# Patient Record
Sex: Female | Born: 2005 | Race: White | Hispanic: No | Marital: Single | Smoking: Never smoker
Health system: Southern US, Community
[De-identification: ages and names within clinical notes are randomized; demographics above are authoritative.]

## PROBLEM LIST (undated history)

## (undated) DIAGNOSIS — E669 Obesity, unspecified: Secondary | ICD-10-CM

## (undated) DIAGNOSIS — N390 Urinary tract infection, site not specified: Secondary | ICD-10-CM

## (undated) HISTORY — DX: Obesity, unspecified: E66.9

---
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
## 2023-02-16 ENCOUNTER — Ambulatory Visit
Admission: EM | Admit: 2023-02-16 | Discharge: 2023-02-16 | Disposition: A | Discharge status: Home / Self Care | Payer: Medicaid Other | Attending: Family Medicine | Admitting: Family Medicine

## 2023-02-16 ENCOUNTER — Ambulatory Visit (INDEPENDENT_AMBULATORY_CARE_PROVIDER_SITE_OTHER): Payer: Medicaid Other

## 2023-02-16 DIAGNOSIS — R079 Chest pain, unspecified: Secondary | ICD-10-CM | POA: Diagnosis not present

## 2023-02-16 MED ORDER — IBUPROFEN 800 MG PO TABS: 800.0000 mg | ORAL_TABLET | Freq: Three times a day (TID) | ORAL | 0 refills | Status: DC

## 2023-02-16 NOTE — ED Triage Notes (Signed)
Pt presents with right side rib pain and flank pain with no known injury X 2 days.

## 2023-02-17 NOTE — ED Provider Notes (Signed)
[facility]   [id number] 02/16/23 Arrival Time: 1757  ASSESSMENT & PLAN:  1. Right-sided chest pain     Patient history and exam consistent with non-cardiac cause of chest pain. I have personally viewed and independently interpreted the imaging studies ordered this visit. No acute changes on CXR; no pneumothorax.  She is comfortable with observation. Treat for likely MSK chest wall pain.  Meds ordered this encounter  Medications   ibuprofen (ADVIL) 800 MG tablet    Sig: Take 1 tablet (800 mg total) by mouth 3 (three) times daily with meals.    Dispense:  21 tablet    Refill:  0    Chest pain precautions given. Reviewed expectations re: course of current medical issues. Questions answered. Outlined signs and symptoms indicating need for more acute intervention. Patient verbalized understanding. After Visit Summary given.   SUBJECTIVE:  History from: patient. Natasha Edwards is a 17 y.o. female who presents with complaint of non-radiating right-sided chest/rib pain; x 2 days; gradual onset; denies injury/trauma. Denies SOB. Afebrile. No tx PTA. Denies recreational drug use. No rash.  Social History   Tobacco Use  Smoking Status Never  Smokeless Tobacco Never   Social History   Substance and Sexual Activity  Alcohol Use No    OBJECTIVE:  Vitals:   02/16/23 1854  BP: 125/78  Pulse: (!) 122  Resp: 17  Temp: 98.7 F (37.1 C)  TempSrc: Oral  SpO2: 98%    Tachycardia noted but this appears to be her baseline compared with VS in Epic.  General appearance: alert, oriented, no acute distress Eyes: PERRLA; EOMI; conjunctivae normal HENT: normocephalic; atraumatic Neck: supple with FROM Lungs: without labored respirations; speaks full sentences without difficulty; CTAB Heart: regular rate and rhythm without murmer Chest Wall: with some tenderness to palpation over R sided chest wall Abdomen: soft, non-tender; no guarding or rebound  tenderness Extremities: without edema; without calf swelling or tenderness; symmetrical without gross deformities Skin: warm and dry; without rash or lesions Neuro: normal gait Psychological: alert and cooperative; normal mood and affect  Imaging: DG Chest 2 View  Result Date: 02/16/2023 CLINICAL DATA:  Chest pain. EXAM: CHEST - 2 VIEW COMPARISON:  None Available. FINDINGS: The heart size and mediastinal contours are within normal limits. Both lungs are clear. The visualized skeletal structures are unremarkable. IMPRESSION: No active cardiopulmonary disease. Electronically Signed   By: Aram Candela M.D.   On: 02/16/2023 19:28     No Known Allergies  Past Medical History:  Diagnosis Date   Obesity (BMI 30.0-34.9) 04/05/2018   UTI (urinary tract infection)    on an antibiotic for one year.    Social History   Socioeconomic History   Marital status: Single    Spouse name: Not on file   Number of children: Not on file   Years of education: Not on file   Highest education level: Not on file  Occupational History   Not on file  Tobacco Use   Smoking status: Never   Smokeless tobacco: Never  Vaping Use   Vaping Use: Never used  Substance and Sexual Activity   Alcohol use: No   Drug use: No   Sexual activity: Never  Other Topics Concern   Not on file  Social History Narrative   Not on file   Social Determinants of Health   Financial Resource Strain: Not on file  Food Insecurity: Not on file  Transportation Needs: Not on file  Physical Activity: Not on  file  Stress: Not on file  Social Connections: Not on file  Intimate Partner Violence: Not on file   History reviewed. No pertinent family history. Past Surgical History:  Procedure Laterality Date   ORIF ANKLE FRACTURE Right 08/15/2019   Procedure: OPEN REDUCTION INTERNAL FIXATION (ORIF) ANKLE FRACTURE;  Surgeon: Vickki Hearing, MD;  Location: [facility];  Service: Orthopedics;  Laterality: Right;      Mardella Layman, MD 02/17/23 [phone]

## 2023-02-18 ENCOUNTER — Ambulatory Visit (INDEPENDENT_AMBULATORY_CARE_PROVIDER_SITE_OTHER): Payer: Medicaid Other | Admitting: Family Medicine

## 2023-02-18 VITALS — BP 124/85 | HR 104 | Ht 72.04 in | Wt 242.8 lb

## 2023-02-18 DIAGNOSIS — E161 Other hypoglycemia: Secondary | ICD-10-CM | POA: Diagnosis not present

## 2023-02-18 DIAGNOSIS — E669 Obesity, unspecified: Secondary | ICD-10-CM

## 2023-02-18 DIAGNOSIS — E282 Polycystic ovarian syndrome: Secondary | ICD-10-CM | POA: Diagnosis not present

## 2023-02-18 DIAGNOSIS — E66811 Obesity, class 1: Secondary | ICD-10-CM

## 2023-02-18 DIAGNOSIS — R635 Abnormal weight gain: Secondary | ICD-10-CM

## 2023-02-18 NOTE — Progress Notes (Signed)
   Subjective:    Patient ID: Natasha Edwards, female    DOB: 03-09-2006, 17 y.o.   MRN: [id number]  HPI Very nice young patient here today for discussion about her weight gain she has underlying PCOS She is doing part-time work she is also working toward getting a degree in Oncologist She denies being depressed States her moods overall are doing well At times she feels like she is on the outside Diesel Lina in this will last 1 to 3 days at a time Patient arrives today wanted to discuss weight loss medication and patient is having issues with derealization (patient states it comes and goes).     Review of Systems     Objective:   Physical Exam General-in no acute distress Eyes-no discharge Lungs-respiratory rate normal, CTA CV-no murmurs,RRR Extremities skin warm dry no edema Neuro grossly normal Behavior normal, alert        Assessment & Plan:  1. PCOS (polycystic ovarian syndrome) Check lab work await the results Healthy diet printout given Activity printout given Hold off on medications currently There are no weight loss medicine suitable that are covered - Basic Metabolic Panel (7)  2. Hyperinsulinemia Check lab work await results may need further testing depending on these - Insulin, random - Glucose, Random  3. Obesity (BMI 30.0-34.9) Portion control regular physical activity regular activity is very important We did discuss healthy snacks  4. Weight gain Fit in regular physical activity and exercise lab works ordered - Basic Metabolic Panel (7) - TSH

## 2023-02-19 LAB — BASIC METABOLIC PANEL (7)
BUN/Creatinine Ratio: 16 (ref 9–23)
BUN: 12 mg/dL (ref 6–20)
CO2: 18 mmol/L — ABNORMAL LOW (ref 20–29)
Chloride: 105 mmol/L (ref 96–106)
Creatinine, Ser: 0.75 mg/dL (ref 0.57–1.00)
Glucose: 84 mg/dL (ref 70–99)
Potassium: 4.1 mmol/L (ref 3.5–5.2)
Sodium: 143 mmol/L (ref 134–144)
eGFR: 118 mL/min/{1.73_m2} (ref >59)

## 2023-02-19 LAB — TSH: TSH: 2.83 u[IU]/mL (ref 0.450–4.500)

## 2023-02-19 LAB — INSULIN, RANDOM: INSULIN: 27.6 u[IU]/mL — ABNORMAL HIGH (ref 2.6–24.9)

## 2023-05-25 NOTE — Progress Notes (Signed)
This encounter was created in error - please disregard.

## 2023-06-17 ENCOUNTER — Ambulatory Visit: Payer: Medicaid Other | Admitting: Nurse Practitioner

## 2023-06-17 ENCOUNTER — Encounter: Payer: Self-pay | Admitting: Nurse Practitioner

## 2023-06-17 VITALS — BP 125/80 | HR 107 | Temp 98.5°F | Ht 72.0 in | Wt 256.0 lb

## 2023-06-17 DIAGNOSIS — Z0000 Encounter for general adult medical examination without abnormal findings: Secondary | ICD-10-CM | POA: Diagnosis not present

## 2023-06-17 DIAGNOSIS — Z01419 Encounter for gynecological examination (general) (routine) without abnormal findings: Secondary | ICD-10-CM

## 2023-06-17 DIAGNOSIS — Z111 Encounter for screening for respiratory tuberculosis: Secondary | ICD-10-CM

## 2023-06-17 NOTE — Progress Notes (Signed)
Subjective:    Patient ID: Natasha Edwards, female    DOB: Mar 14, 2006, 17 y.o.   MRN: [id number]  HPI The patient comes in today for a wellness visit.    A review of their health history was completed.  A review of medications was also completed.  Any needed refills; no  Eating habits: Eating out more often, consumes sugary beverages.  Falls/  MVA accidents in past few months: no  Regular exercise: Yes, participates in softball several times a week.   Specialist pt sees on regular basis: no  Preventative health issues were discussed.   Additional concerns: school form completion and TB testing  Sexual History: No history of sexual activity, not currently taking birth control. Reports regular periods, lasting 4-5 days with average flow. Denies painful menstrual cramps.    Review of Systems  Constitutional:  Negative for activity change, appetite change and fatigue.  HENT:  Negative for sore throat and trouble swallowing.   Eyes:  Negative for visual disturbance.  Respiratory:  Negative for cough, chest tightness, shortness of breath and wheezing.   Cardiovascular:  Negative for chest pain.  Gastrointestinal:  Negative for abdominal distention, abdominal pain, constipation, diarrhea, nausea and vomiting.  Genitourinary:  Negative for difficulty urinating, dysuria, frequency, genital sores, menstrual problem, pelvic pain, urgency and vaginal discharge.      06/17/2023    8:33 AM  Depression screen PHQ 2/9  Decreased Interest 0  Down, Depressed, Hopeless 0  PHQ - 2 Score 0  Altered sleeping 1  Tired, decreased energy 1  Change in appetite 1  Feeling bad or failure about yourself  0  Trouble concentrating 0  Moving slowly or fidgety/restless 0  Suicidal thoughts 0  PHQ-9 Score 3  Difficult doing work/chores Not difficult at all      06/17/2023    8:34 AM 02/18/2023    8:42 AM  GAD 7 : Generalized Anxiety Score  Nervous, Anxious, on Edge 0 1  Control/stop  worrying 1 0  Worry too much - different things 0 1  Trouble relaxing 0 0  Restless 0 0  Easily annoyed or irritable 1 1  Afraid - awful might happen 0 0  Total GAD 7 Score 2 3  Anxiety Difficulty Not difficult at all Not difficult at all         Objective:   Physical Exam Vitals and nursing note reviewed.  Constitutional:      General: She is not in acute distress.    Appearance: Normal appearance. She is well-developed.  HENT:     Right Ear: Tympanic membrane normal.     Left Ear: Tympanic membrane normal.     Nose: Nose normal.     Mouth/Throat:     Mouth: Mucous membranes are moist.     Pharynx: Oropharynx is clear.  Eyes:     Conjunctiva/sclera: Conjunctivae normal.     Pupils: Pupils are equal, round, and reactive to light.  Neck:     Thyroid: No thyromegaly.     Comments: Thyroid non-tender to palpation. No nodules or goiter noted.  Cardiovascular:     Rate and Rhythm: Normal rate and regular rhythm.     Heart sounds: Normal heart sounds. No murmur heard. Pulmonary:     Effort: Pulmonary effort is normal.     Breath sounds: Normal breath sounds. No wheezing.  Abdominal:     General: There is no distension.     Palpations: Abdomen is soft. There  is no mass.     Tenderness: There is no abdominal tenderness.  Genitourinary:    Comments: Defers GU and breast exams. Denies any problems.  Musculoskeletal:        General: Normal range of motion.     Cervical back: Normal range of motion and neck supple.  Lymphadenopathy:     Cervical: No cervical adenopathy.  Skin:    General: Skin is warm and dry.     Comments: Previous keloid removal incision site to right outer thigh, measuring 4cm x 7cm. Non-tender, firm.  Neurological:     Mental Status: She is alert and oriented to person, place, and time.     Deep Tendon Reflexes: Reflexes are normal and symmetric.  Psychiatric:        Mood and Affect: Mood normal.        Behavior: Behavior normal.        Thought  Content: Thought content normal.        Judgment: Judgment normal.    Today's Vitals   06/17/23 0825  BP: 125/80  Pulse: (!) 107  Temp: 98.5 F (36.9 C)  SpO2: 98%  Weight: 256 lb (116.1 kg)  Height: 6' (1.829 m)   Body mass index is 34.72 kg/m.        Assessment & Plan:   1. Screening-pulmonary TB Results of TB pending. Other immunizations up to date.  - QuantiFERON-TB Gold Plus  2. Well woman exam Discussed increasing exercise to include 30-60 minutes of walking or other activities 3-4x per week. Encouraged eliminating sugary drinks and reducing the amount of fast food consumed. Educated on receiving the flu vaccine for the upcoming season. She gets regular dental exams. Educated on safe-sex practices and encouraged to follow up for any change in sexual activity and/or desire for contraceptives.    Return in about 1 year (around 06/16/2024) for physical.

## 2023-06-17 NOTE — Progress Notes (Signed)
   Subjective:    Patient ID: Natasha Edwards, female    DOB: 2006/06/30, 17 y.o.   MRN: [id number]  HPI    Review of Systems     Objective:   Physical Exam        Assessment & Plan:

## 2023-06-23 LAB — QUANTIFERON-TB GOLD PLUS
QuantiFERON Mitogen Value: >10 [IU]/mL
QuantiFERON Nil Value: 0.01 [IU]/mL
QuantiFERON TB1 Ag Value: 0.01 [IU]/mL
QuantiFERON TB2 Ag Value: 0.01 [IU]/mL
QuantiFERON-TB Gold Plus: NEGATIVE

## 2024-04-11 ENCOUNTER — Ambulatory Visit
Admission: EM | Admit: 2024-04-11 | Discharge: 2024-04-11 | Disposition: A | Discharge status: Home / Self Care | Attending: Family Medicine | Admitting: Family Medicine

## 2024-04-11 DIAGNOSIS — H60501 Unspecified acute noninfective otitis externa, right ear: Secondary | ICD-10-CM | POA: Diagnosis not present

## 2024-04-11 MED ORDER — CIPROFLOXACIN-DEXAMETHASONE 0.3-0.1 % OT SUSP: 4.0000 [drp] | Freq: Two times a day (BID) | OTIC | 0 refills | Status: DC

## 2024-04-11 NOTE — ED Provider Notes (Signed)
 [facility]    CSN: [id number] Arrival date & time: 04/11/24  1843      History   Chief Complaint No chief complaint on file.   HPI Natasha Edwards is a 18 y.o. female.   Patient presenting today with 1 day history of sharp stabbing right ear pain that radiates to the jaw.  Denies injury to the area, drainage, bleeding, loss of hearing, fevers, congestion.  So far trying Advil  with minimal relief.    Past Medical History:  Diagnosis Date   Obesity (BMI 30.0-34.9) 04/05/2018   UTI (urinary tract infection)    on an antibiotic for one year.     Patient Active Problem List   Diagnosis Date Noted   Encounter for routine child health examination without abnormal findings 10/03/2020   Need for vaccination 10/03/2020   S/P ORIF (open reduction internal fixation) fracture right ankle  08/15/19 09/26/2019   Closed fracture of right ankle 09/26/2019   Trimalleolar fracture of ankle, closed, right, with routine healing, subsequent encounter    PCOS (polycystic ovarian syndrome) 04/05/2018   Hyperinsulinemia 04/05/2018   Obesity (BMI 30.0-34.9) 04/05/2018   Amenorrhea 02/22/2018    Past Surgical History:  Procedure Laterality Date   ORIF ANKLE FRACTURE Right 08/15/2019   Procedure: OPEN REDUCTION INTERNAL FIXATION (ORIF) ANKLE FRACTURE;  Surgeon: Margrette Taft BRAVO, MD;  Location: [facility];  Service: Orthopedics;  Laterality: Right;    OB History   No obstetric history on file.      Home Medications    Prior to Admission medications   Medication Sig Start Date End Date Taking? Authorizing Provider  ciprofloxacin -dexamethasone  (CIPRODEX ) OTIC suspension Place 4 drops into the right ear 2 (two) times daily. 04/11/24   Stuart Vernell Norris, PA-C    Family History History reviewed. No pertinent family history.  Social History Social History   Tobacco Use   Smoking status: Never   Smokeless tobacco: Never  Vaping Use   Vaping status: Never Used   Substance Use Topics   Alcohol use: No   Drug use: No     Allergies   Patient has no known allergies.   Review of Systems Review of Systems Per HPI  Physical Exam Triage Vital Signs ED Triage Vitals  Encounter Vitals Group     BP 04/11/24 1855 134/71     Girls Systolic BP Percentile --      Girls Diastolic BP Percentile --      Boys Systolic BP Percentile --      Boys Diastolic BP Percentile --      Pulse --      Resp 04/11/24 1855 20     Temp 04/11/24 1855 98.1 F (36.7 C)     Temp Source 04/11/24 1855 Oral     SpO2 04/11/24 1855 98 %     Weight --      Height --      Head Circumference --      Peak Flow --      Pain Score 04/11/24 1858 7     Pain Loc --      Pain Education --      Exclude from Growth Chart --    No data found.  Updated Vital Signs BP 134/71 (BP Location: Right Arm)   Temp 98.1 F (36.7 C) (Oral)   Resp 20   SpO2 98%   Visual Acuity Right Eye Distance:   Left Eye Distance:   Bilateral Distance:    Right  Eye Near:   Left Eye Near:    Bilateral Near:     Physical Exam Vitals and nursing note reviewed.  Constitutional:      Appearance: Normal appearance. She is not ill-appearing.  HENT:     Head: Atraumatic.     Ears:     Comments: Pain with tugging on right auricle, right EAC erythematous, edematous, crusted drainage present.  Right TM benign appearing    Mouth/Throat:     Mouth: Mucous membranes are moist.     Pharynx: Oropharynx is clear.   Eyes:     Extraocular Movements: Extraocular movements intact.     Conjunctiva/sclera: Conjunctivae normal.    Cardiovascular:     Rate and Rhythm: Normal rate.  Pulmonary:     Effort: Pulmonary effort is normal.   Musculoskeletal:        General: Normal range of motion.     Cervical back: Normal range of motion and neck supple.   Skin:    General: Skin is warm and dry.   Neurological:     Mental Status: She is alert and oriented to person, place, and time.   Psychiatric:         Mood and Affect: Mood normal.        Thought Content: Thought content normal.        Judgment: Judgment normal.      UC Treatments / Results  Labs (all labs ordered are listed, but only abnormal results are displayed) Labs Reviewed - No data to display  EKG   Radiology No results found.  Procedures Procedures (including critical care time)  Medications Ordered in UC Medications - No data to display  Initial Impression / Assessment and Plan / UC Course  I have reviewed the triage vital signs and the nursing notes.  Pertinent labs & imaging results that were available during my care of the patient were reviewed by me and considered in my medical decision making (see chart for details).     Consistent with otitis externa, treat with Ciprodex  drops, over-the-counter pain relievers, supportive home care.  Return for worsening symptoms.  Final Clinical Impressions(s) / UC Diagnoses   Final diagnoses:  Acute otitis externa of right ear, unspecified type   Discharge Instructions   None    ED Prescriptions     Medication Sig Dispense Auth. Provider   ciprofloxacin -dexamethasone  (CIPRODEX ) OTIC suspension  (Status: Discontinued) Place 4 drops into the right ear 2 (two) times daily. 7.5 mL Stuart Vernell Norris, PA-C   ciprofloxacin -dexamethasone  (CIPRODEX ) OTIC suspension Place 4 drops into the right ear 2 (two) times daily. 7.5 mL Stuart Vernell Norris, [place]      PDMP not reviewed this encounter.   Stuart Vernell Norris, [place] 04/11/24 1932

## 2024-04-11 NOTE — ED Triage Notes (Signed)
 Pt reports right ear pain feeling pain and discomfort in the right side of face onset today.

## 2024-04-12 ENCOUNTER — Encounter: Payer: Self-pay | Admitting: Family Medicine

## 2024-04-12 ENCOUNTER — Telehealth: Payer: Self-pay

## 2024-04-12 NOTE — Telephone Encounter (Signed)
 Returned patient call regarding ear pain that is still going on from yesterday. Informed patient that sxs may not begin to clear up until 2 days on the cipro -dex. Pt verbalized understanding.

## 2024-04-14 ENCOUNTER — Encounter (HOSPITAL_COMMUNITY): Payer: Self-pay | Admitting: Emergency Medicine

## 2024-04-14 ENCOUNTER — Other Ambulatory Visit: Payer: Self-pay

## 2024-04-14 ENCOUNTER — Emergency Department (HOSPITAL_COMMUNITY)
Admission: EM | Admit: 2024-04-14 | Discharge: 2024-04-14 | Disposition: A | Discharge status: Home / Self Care | Attending: Emergency Medicine | Admitting: Emergency Medicine

## 2024-04-14 DIAGNOSIS — H60393 Other infective otitis externa, bilateral: Secondary | ICD-10-CM | POA: Insufficient documentation

## 2024-04-14 DIAGNOSIS — H9203 Otalgia, bilateral: Secondary | ICD-10-CM | POA: Insufficient documentation

## 2024-04-14 DIAGNOSIS — H9201 Otalgia, right ear: Secondary | ICD-10-CM | POA: Diagnosis present

## 2024-04-14 MED ORDER — HYDROCODONE-ACETAMINOPHEN 5-325 MG PO TABS: 1.0000 | ORAL_TABLET | Freq: Four times a day (QID) | ORAL | 0 refills | Status: DC | PRN

## 2024-04-14 MED ORDER — AMOXICILLIN-POT CLAVULANATE 875-125 MG PO TABS
1.0000 | ORAL_TABLET | Freq: Once | ORAL | Status: AC
  Administered 2024-04-14: 1 via ORAL
  Filled 2024-04-14: qty 1

## 2024-04-14 MED ORDER — OFLOXACIN 0.3 % OT SOLN: 3.0000 [drp] | Freq: Two times a day (BID) | OTIC | 0 refills | Status: AC

## 2024-04-14 MED ORDER — AMOXICILLIN-POT CLAVULANATE 875-125 MG PO TABS: 1.0000 | ORAL_TABLET | Freq: Two times a day (BID) | ORAL | 0 refills | Status: DC

## 2024-04-14 MED ORDER — HYDROCODONE-ACETAMINOPHEN 5-325 MG PO TABS
1.0000 | ORAL_TABLET | Freq: Once | ORAL | Status: AC
  Administered 2024-04-14: 1 via ORAL
  Filled 2024-04-14: qty 1

## 2024-04-14 NOTE — ED Provider Notes (Signed)
 [place] EM[facility]Y DEPARTMENT AT [facility]  Provider Note  CSN: [id number] Arrival date & time: 04/14/24 0409  History Chief Complaint  Patient presents with   Otalgia    Natasha Edwards is a 18 y.o. female with no significant PMH has had 3 days of R sided ear ache, saw UC on 6/24 and given ciprodex  drops for OE without improvement. Messaged her PCP 6/25 and advised to take Motrin /APAP for pain which has not been effective. She reports pain has moved to the left ear now. Denies fever. Some muffled hearing. No bleeding or discharge from ears.    Home Medications Prior to Admission medications   Medication Sig Start Date End Date Taking? Authorizing Provider  amoxicillin -clavulanate (AUGMENTIN) 875-125 MG tablet Take 1 tablet by mouth every 12 (twelve) hours. 04/14/24  Yes Roselyn Carlin NOVAK, MD  HYDROcodone -acetaminophen  (NORCO/VICODIN) 5-325 MG tablet Take 1 tablet by mouth every 6 (six) hours as needed for severe pain (pain score 7-10). 04/14/24  Yes Roselyn Carlin NOVAK, MD  ofloxacin (FLOXIN) 0.3 % OTIC solution Place 3 drops into both ears 2 (two) times daily for 7 days. 04/14/24 04/21/24 Yes Roselyn Carlin NOVAK, MD     Allergies    Patient has no known allergies.   Review of Systems   Review of Systems Please see HPI for pertinent positives and negatives  Physical Exam BP (!) 141/86 (BP Location: Right Arm)   Pulse (!) 117   Temp 98.8 F (37.1 C) (Oral)   Resp 18   Ht 6' (1.829 m)   Wt 116 kg   LMP 03/10/2024 (Approximate)   SpO2 97%   BMI 34.68 kg/m   Physical Exam Vitals and nursing note reviewed.  HENT:     Head: Normocephalic.     Ears:     Comments: R canal is swollen with purulent material, TM is obscured. L canal is swollen without discharge, limited view of TM appears intact and without infection.     Nose: Nose normal.   Eyes:     Extraocular Movements: Extraocular movements intact.   Pulmonary:     Effort: Pulmonary effort is normal.    Musculoskeletal:        General: Normal range of motion.     Cervical back: Neck supple.  Lymphadenopathy:     Cervical: No cervical adenopathy.   Skin:    Findings: No rash (on exposed skin).   Neurological:     Mental Status: She is alert and oriented to person, place, and time.   Psychiatric:        Mood and Affect: Mood normal.     ED Results / Procedures / Treatments   EKG None  Procedures Procedures  Medications Ordered in the ED Medications  amoxicillin -clavulanate (AUGMENTIN) 875-125 MG per tablet 1 tablet (has no administration in time range)  HYDROcodone -acetaminophen  (NORCO/VICODIN) 5-325 MG per tablet 1 tablet (1 tablet Oral Given 04/14/24 0440)    Initial Impression and Plan  Patient here with progressively worsening R OE now with signs of early L OE. Ear wick placed in R ear. Will switch Abx to oflaxacin given poor response to Ciprodex . Add oral Abx given non-visualized TM and Norco for breakthrough pain. Advised not to take with additional APAP.   ED Course       MDM Rules/Calculators/A&P Medical Decision Making Problems Addressed: Other infective acute otitis externa of both ears: acute illness or injury  Risk Prescription drug management.     Final Clinical  Impression(s) / ED Diagnoses Final diagnoses:  Other infective acute otitis externa of both ears    Rx / DC Orders ED Discharge Orders          Ordered    amoxicillin -clavulanate (AUGMENTIN) 875-125 MG tablet  Every 12 hours        04/14/24 0441    ofloxacin (FLOXIN) 0.3 % OTIC solution  2 times daily        04/14/24 0441    HYDROcodone -acetaminophen  (NORCO/VICODIN) 5-325 MG tablet  Every 6 hours PRN        04/14/24 0441             Roselyn Carlin NOVAK, MD 04/14/24 [phone]

## 2024-04-14 NOTE — ED Notes (Signed)
 ED Provider at bedside.

## 2024-04-14 NOTE — ED Triage Notes (Signed)
 Pt here for R ear pain (recently seen at [facility] and dx with swimmers ear . Pt taking ear drops without relief. States pain has moved to other ear as well.

## 2024-07-04 ENCOUNTER — Ambulatory Visit (INDEPENDENT_AMBULATORY_CARE_PROVIDER_SITE_OTHER): Admitting: Nurse Practitioner

## 2024-07-04 ENCOUNTER — Encounter: Payer: Self-pay | Admitting: Nurse Practitioner

## 2024-07-04 VITALS — BP 129/87 | HR 117 | Ht 72.0 in | Wt 263.0 lb

## 2024-07-04 DIAGNOSIS — H9192 Unspecified hearing loss, left ear: Secondary | ICD-10-CM

## 2024-07-04 DIAGNOSIS — J300 Vasomotor rhinitis: Secondary | ICD-10-CM

## 2024-07-04 NOTE — Patient Instructions (Signed)
 Flonase Allegra

## 2024-07-04 NOTE — Progress Notes (Unsigned)
 Subjective:    Patient ID: Natasha Edwards, female    DOB: [birth year], 18 y.o.   MRN: [id number]  HPI CC: Acute visit. ENT referral.  Patient arrived with the desire for an ENT referral due to inconsistent bilateral ear pain. She described the pain as beginning years ago and sharp. She stated that her ears are not currently hurting today but on her left side sounds are muffled. She does not experience any dizziness, n/v, or chest pain. Was seen at [facility] and ED on 6/24 and 6/27 with a diagnosis of otitis externa. No drainage from the ears.    Review of Systems  Constitutional:  Negative for fever.  HENT:  Positive for ear pain. Negative for ear discharge and sore throat.   Respiratory:  Negative for cough, shortness of breath and wheezing.   Cardiovascular:  Negative for chest pain.  Gastrointestinal:  Negative for constipation, diarrhea, nausea and vomiting.  Neurological:  Negative for dizziness and syncope.   Social History   Tobacco Use   Smoking status: Never   Smokeless tobacco: Never  Vaping Use   Vaping status: Never Used  Substance Use Topics   Alcohol use: No   Drug use: No        Objective:   Physical Exam Vitals and nursing note reviewed.  Constitutional:      Appearance: Normal appearance.  HENT:     Right Ear: Ear canal and external ear normal. No drainage or tenderness. Tympanic membrane is not erythematous.     Left Ear: Ear canal and external ear normal. No drainage or tenderness. Tympanic membrane is not erythematous.     Ears:     Comments: Bilateral TM  visualized with clear fluid.  Right ear partially obscured. cerumen removal with curette without difficulty No tenderness with movement of the pinna or tragus bilaterally.     Mouth/Throat:     Mouth: Mucous membranes are moist.     Pharynx: Oropharynx is clear. No posterior oropharyngeal erythema.     Comments: Slight cloudy PND noted.  Cardiovascular:     Rate and Rhythm: Normal rate and regular  rhythm.  Pulmonary:     Effort: Pulmonary effort is normal.     Breath sounds: Normal breath sounds.  Musculoskeletal:     Cervical back: Neck supple.  Lymphadenopathy:     Cervical: No cervical adenopathy.  Neurological:     Mental Status: She is alert.     Comments: Can hear in conversational tones. Grossly intact.  Psychiatric:        Mood and Affect: Mood normal.        Behavior: Behavior normal.        Thought Content: Thought content normal.    Vitals:   07/04/24 1505  BP: 129/87  Pulse: (!) 117  Height: 6' (1.829 m)  Weight: 119.3 kg  SpO2: 96%  BMI (Calculated): 35.66          Assessment & Plan:  1. Vasomotor rhinitis (Primary) Patient educated on taking over the counter Flonase and Allegra.  She was given education on how to decompact her ears at home with 1/2 hydrogen peroxide, 1/2 warm water mixture and provided 2 droppers.  If symptoms do to not improve within 7-10 days, patient should contact the office for an ENT referral.  2. Decreased hearing of left ear most likely due to effusion Patient educated on taking over the counter Flonase and Allergra.  If symptoms do to not improve within 7-10  days, patient should contact the office for an ENT referral.   Return if symptoms worsen or fail to improve.   I have seen and examined this patient alongside the NP student. I have reviewed and verified the student note and agree with the assessment and plan.  Elveria Quarry, FNP

## 2024-07-06 ENCOUNTER — Encounter: Payer: Self-pay | Admitting: Nurse Practitioner

## 2024-07-11 ENCOUNTER — Ambulatory Visit

## 2024-07-11 DIAGNOSIS — Z111 Encounter for screening for respiratory tuberculosis: Secondary | ICD-10-CM

## 2024-07-13 LAB — TB SKIN TEST
Induration: 0 mm
TB Skin Test: NEGATIVE

## 2024-07-21 DIAGNOSIS — Z111 Encounter for screening for respiratory tuberculosis: Secondary | ICD-10-CM

## 2024-08-01 ENCOUNTER — Encounter: Payer: Self-pay | Admitting: Nurse Practitioner

## 2024-08-02 ENCOUNTER — Encounter: Payer: Self-pay | Admitting: Nurse Practitioner

## 2024-08-09 ENCOUNTER — Encounter: Payer: Self-pay | Admitting: Family Medicine

## 2024-08-10 ENCOUNTER — Other Ambulatory Visit: Payer: Self-pay | Admitting: Nurse Practitioner

## 2024-08-10 DIAGNOSIS — Z7721 Contact with and (suspected) exposure to potentially hazardous body fluids: Secondary | ICD-10-CM

## 2024-08-29 NOTE — Progress Notes (Signed)
 Our clinic recently performed a tuberculosis skin test on one of your employees, Natasha Edwards. The results of this test are as follows:  No results found for: PPD This test was negative for tuberculosis exposure per current Centers for Disease Control guidelines.  A chest x-ray was not required.

## 2024-11-08 ENCOUNTER — Encounter: Payer: Self-pay | Admitting: Family Medicine

## 2024-11-08 ENCOUNTER — Encounter: Payer: Self-pay | Admitting: Nurse Practitioner

## 2024-11-09 ENCOUNTER — Encounter: Payer: Self-pay | Admitting: Family Medicine

## 2024-11-09 ENCOUNTER — Encounter: Payer: Self-pay | Admitting: Nurse Practitioner

## 2024-11-09 NOTE — Telephone Encounter (Signed)
 A letter was dictated Please print the letter and have me sign it please notify patient if she would like to have a signed letter she can pick it up otherwise she can print it off of my chart
